# Patient Record
Sex: Female | Born: 1960 | Race: White | Hispanic: No | Marital: Married
Health system: Southern US, Community
[De-identification: ages and names within clinical notes are randomized; demographics above are authoritative.]

---

## 2006-11-19 ENCOUNTER — Ambulatory Visit: Payer: Self-pay | Admitting: Family Medicine

## 2006-12-04 ENCOUNTER — Ambulatory Visit: Payer: Self-pay | Admitting: Family Medicine

## 2008-01-24 ENCOUNTER — Ambulatory Visit: Payer: Self-pay | Admitting: Family Medicine

## 2008-07-19 IMAGING — US ULTRASOUND LEFT BREAST
1 series · 13 of 13 positions shown · non-contrast
Comparison: none

REASON FOR EXAM: Left breast density     US if needed
COMMENTS:

PROCEDURE:     US  - US BREAST LEFT  - December 04, 2006 [DATE]
RESULT:       The LEFT breast was evaluated in the region of interest from
the 12 o'clock to 3 o'clock position.  No solid or cystic sonographic
abnormalities are appreciated.

[Series 1: ultrasound left breast · 13 of 13 slices shown]
[im 1/13]
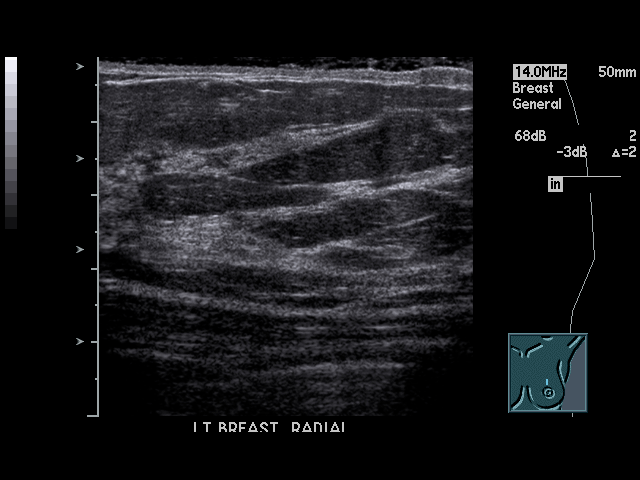
[im 2/13]
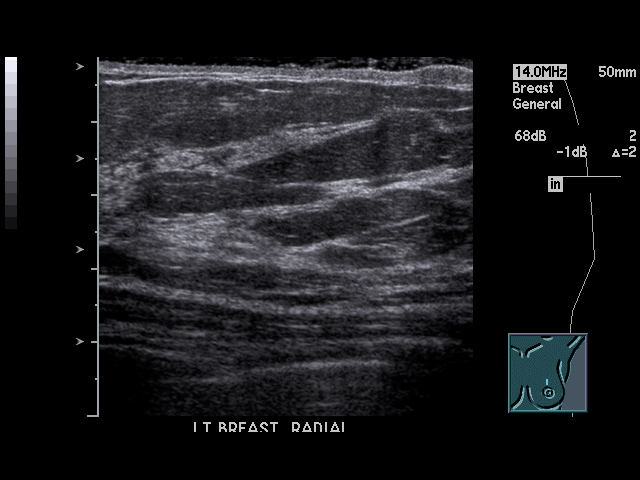
[im 3/13]
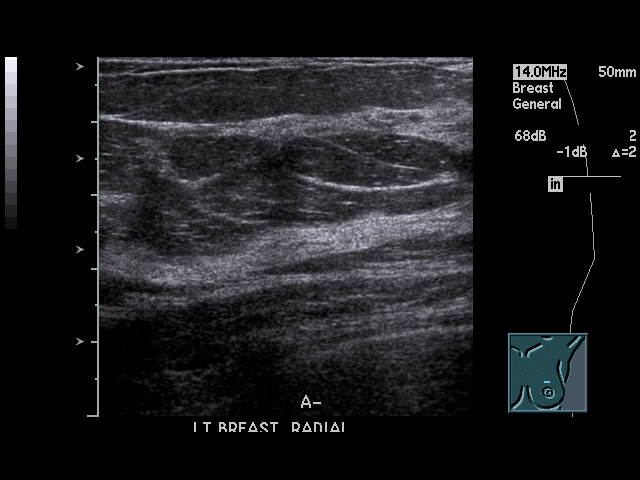
[im 4/13]
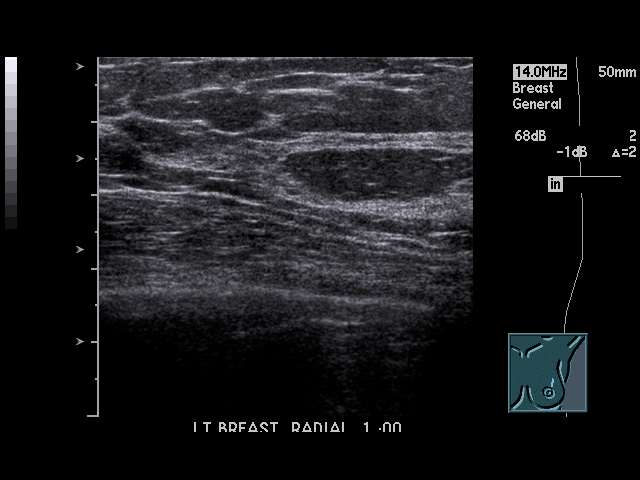
[im 5/13]
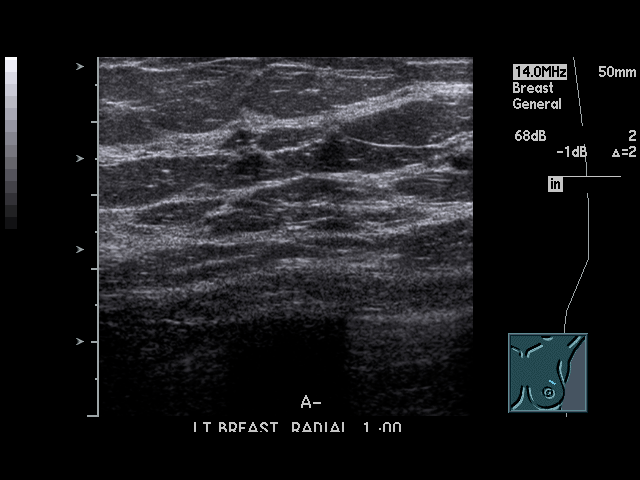
[im 6/13]
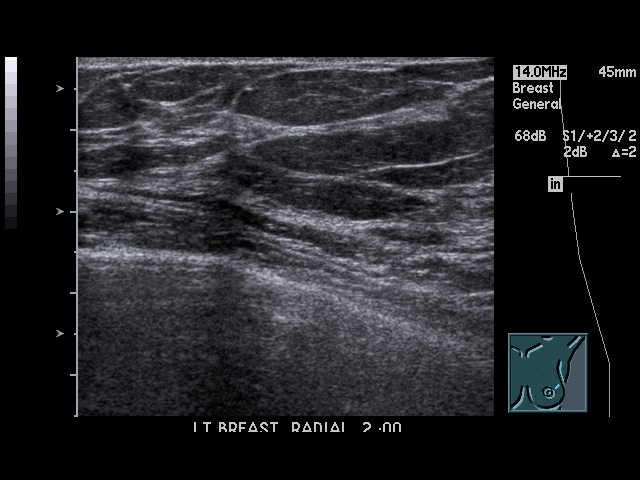
[im 7/13]
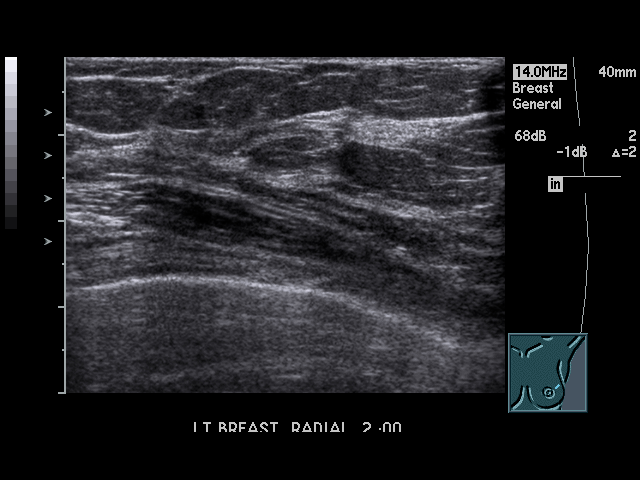
[im 8/13]
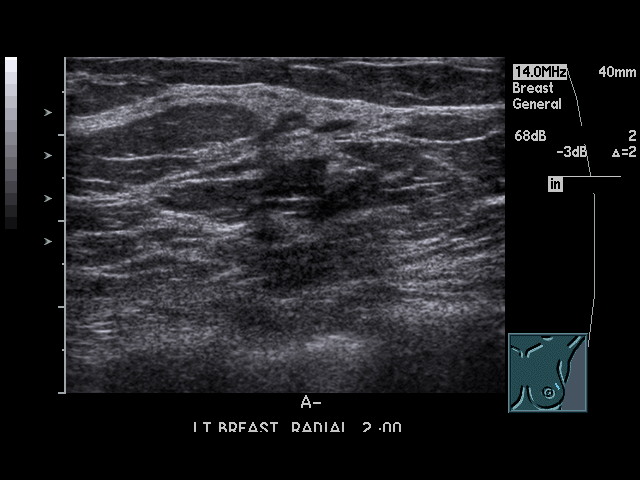
[im 9/13]
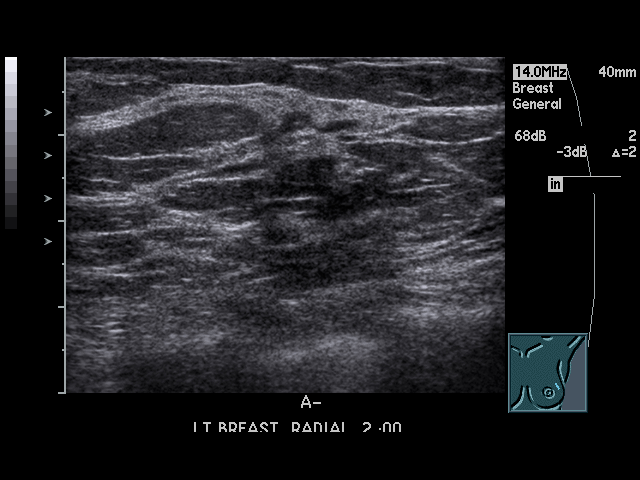
[im 10/13]
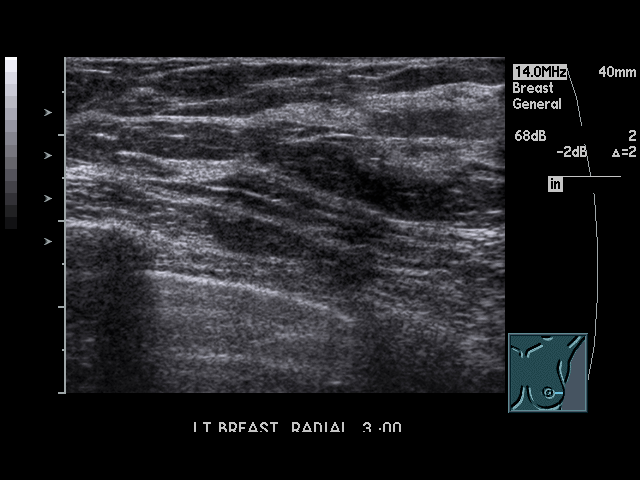
[im 11/13]
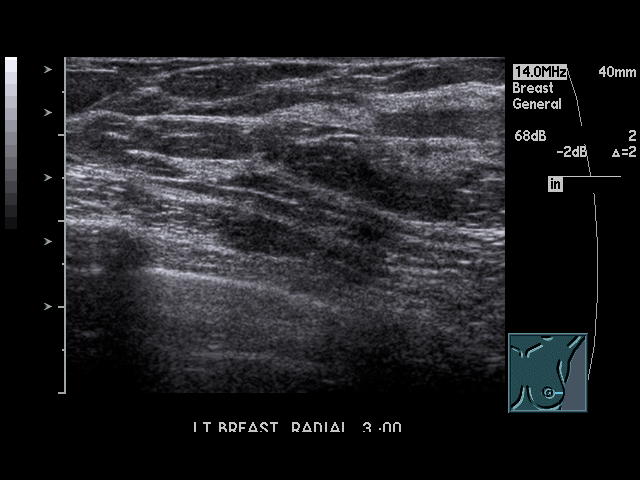
[im 12/13]
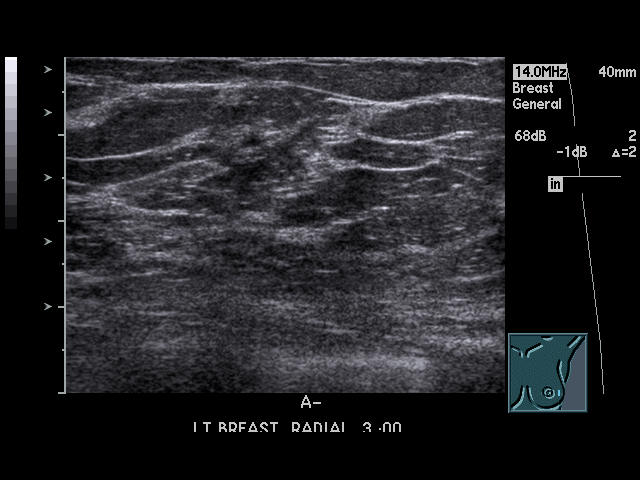
[im 13/13]
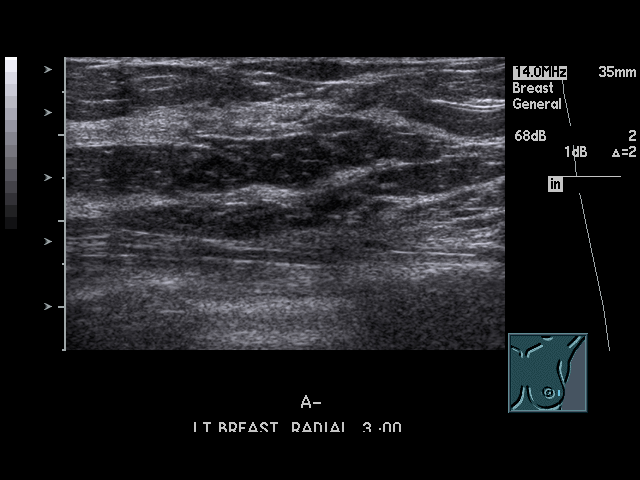

[13 of 13 positions shown; findings below may reference images not displayed]

IMPRESSION: 1.     Benign findings, LEFT breast.
2.     Please refer to the additional radiographic view dictation for
complete discussion.

## 2009-10-05 ENCOUNTER — Ambulatory Visit: Payer: Self-pay | Admitting: Family Medicine

## 2009-11-05 ENCOUNTER — Ambulatory Visit: Payer: Self-pay | Admitting: Family Medicine

## 2021-01-18 ENCOUNTER — Telehealth: Payer: Self-pay

## 2021-01-18 NOTE — Telephone Encounter (Signed)
Pt left msg on triage needing copy of immunization for mumps/measles/rubella she had at the hospital when she delivered her child (11/17/1998). There is no immunization record on [facility] and [facility] for pt. Can you help out please?
